# Patient Record
Sex: Female | Born: 1955 | Race: White | Hispanic: No | Marital: Single | Smoking: Former smoker
Health system: Southern US, Community
[De-identification: ages and names within clinical notes are randomized; demographics above are authoritative.]

---

## 2009-06-03 ENCOUNTER — Ambulatory Visit: Payer: Self-pay | Admitting: Family Medicine

## 2009-06-03 DIAGNOSIS — J069 Acute upper respiratory infection, unspecified: Secondary | ICD-10-CM | POA: Insufficient documentation

## 2009-06-03 LAB — CONVERTED CEMR LAB
Inflenza A Ag: NEGATIVE
Influenza B Ag: NEGATIVE

## 2009-06-28 ENCOUNTER — Encounter: Admission: RE | Admit: 2009-06-28 | Discharge: 2009-07-20 | Payer: Self-pay | Admitting: Orthopedic Surgery

## 2010-08-11 ENCOUNTER — Ambulatory Visit: Payer: Self-pay | Admitting: Family Medicine

## 2010-08-11 DIAGNOSIS — J939 Pneumothorax, unspecified: Secondary | ICD-10-CM | POA: Insufficient documentation

## 2010-08-11 DIAGNOSIS — R05 Cough: Secondary | ICD-10-CM

## 2010-08-11 DIAGNOSIS — J930 Spontaneous tension pneumothorax: Secondary | ICD-10-CM

## 2010-08-12 ENCOUNTER — Encounter: Payer: Self-pay | Admitting: Family Medicine

## 2010-08-12 ENCOUNTER — Ambulatory Visit: Payer: Self-pay | Admitting: Family Medicine

## 2010-08-14 ENCOUNTER — Telehealth (INDEPENDENT_AMBULATORY_CARE_PROVIDER_SITE_OTHER): Payer: Self-pay | Admitting: *Deleted

## 2010-08-29 ENCOUNTER — Encounter: Payer: Self-pay | Admitting: Family Medicine

## 2010-08-30 ENCOUNTER — Encounter: Payer: Self-pay | Admitting: Family Medicine

## 2010-10-11 ENCOUNTER — Telehealth (INDEPENDENT_AMBULATORY_CARE_PROVIDER_SITE_OTHER): Payer: Self-pay | Admitting: *Deleted

## 2010-11-27 NOTE — Letter (Signed)
Summary: RELEASE OF RECORDS TO PCP FORM  RELEASE OF RECORDS TO PCP FORM   Imported By: Dannette Barbara 08/29/2010 12:31:37  _____________________________________________________________________  External Attachment:    Type:   Image     Comment:   External Document

## 2010-11-27 NOTE — Assessment & Plan Note (Signed)
Summary: TAKE XRAY/TJ    Current Allergies: ! ASA ! * MENTHOL  Plan New Medications/Changes: TUSSICAPS 10-8 MG XR12H-CAP (HYDROCOD POLST-CHLORPHEN POLST) One by mouth hs as needed cough  #10 (ten) x 0, 08/12/2010, Donna Christen MD ADVAIR DISKUS 100-50 MCG/DOSE MISC (FLUTICASONE-SALMETEROL) 1 inhalation two times a day  #1 disp pk 60 x 0, 08/12/2010, Donna Christen MD   The patient and/or caregiver has been counseled thoroughly with regard to medications prescribed including dosage, schedule, interactions, rationale for use, and possible side effects and they verbalize understanding.  Diagnoses and expected course of recovery discussed and will return if not improved as expected or if the condition worsens. Patient and/or caregiver verbalized understanding.  Prescriptions: TUSSICAPS 10-8 MG XR12H-CAP (HYDROCOD POLST-CHLORPHEN POLST) One by mouth hs as needed cough  #10 (ten) x 0   Entered and Authorized by:   Donna Christen MD   Signed by:   Donna Christen MD on 08/12/2010   Method used:   Print then Give to Patient   RxID:   [id number] ADVAIR DISKUS 100-50 MCG/DOSE MISC (FLUTICASONE-SALMETEROL) 1 inhalation two times a day  #1 disp pk 60 x 0   Entered and Authorized by:   Donna Christen MD   Signed by:   Donna Christen MD on 08/12/2010   Method used:   Print then Give to Patient   RxID:   [id number]

## 2010-11-27 NOTE — Letter (Signed)
Summary: External Other  External Other   Imported By: Dannette Barbara 08/30/2010 09:46:46  _____________________________________________________________________  External Attachment:    Type:   Image     Comment:   External Document

## 2010-11-27 NOTE — Progress Notes (Signed)
  Phone Note Outgoing Call Call back at [facility] Phone [phone]   Call placed by: Emilio Math,  August 14, 2010 3:28 PM Call placed to: Patient Summary of Call: Feeling better, cough better, still congested

## 2010-11-27 NOTE — Assessment & Plan Note (Signed)
    Vital Signs:  Patient Profile:   55 Years Old Female Height:     65 inches O2 Sat:      97 % Pulse rate:   87 / minute  Vitals Entered By: Joanne Chars CMA (August 12, 2010 2:34 PM)  Subjective:  Patient reports that she feels somewhat better but still has persistent cough at night despite Tessalon Perles.   No shortness of breath or pleuritic pain.  Albuterol inhaler helps somewhat.  No fever.  Objective:  Appearance:  Patient appears healthy, stated age, and in no acute distress  Lungs:  Clear to auscultation.  Breath sounds are equal.  Heart:  Regular rate and rhythm without murmurs, rubs, or gallops.   CHEST - 2 VIEW 08/12/2010:   Comparison: Two-view chest x-ray yesterday.   Findings: No residual right pneumothorax.  Emphysematous changes in the upper lobes, right greater than left, and pulmonary hyperinflation, unchanged.  Lungs clear.  No pleural effusions. Cardiomediastinal silhouette unremarkable.  Mild degenerative changes involving the thoracic spine with mild compression deformity of the upper endplate of a mid thoracic vertebra as noted.   IMPRESSION: No residual right pneumothorax.  COPD/emphysema.  No acute cardiopulmonary disease currently.  Assessment:   Pneumothorax resolved Persistent cough COPD  Note compression deformity of mid thoracic vertebra  Plan:    Add Advair.  May continue albuterol MDI.  Finish antibiotic.  Switch to Tussicaps at bedtime. Follow-up with PCP if cough not improving. Recommend follow-up with PCP for DEXA scan, vitamin D level.  Discussed calcium supplement.

## 2010-11-27 NOTE — Letter (Signed)
Summary: CONTROLLED MED RX POLICY  CONTROLLED MED RX POLICY   Imported By: Dannette Barbara 08/12/2010 16:54:36  _____________________________________________________________________  External Attachment:    Type:   Image     Comment:   External Document

## 2010-11-27 NOTE — Assessment & Plan Note (Signed)
Summary: Cough- x 2 mths, HA, sorethroat, fatigue x 2 dys rm 4   Vital Signs:  Patient Profile:   55 Years Old Female CC:      Cold & URI symptoms Height:     65 inches Weight:      203 pounds O2 Sat:      99 % O2 treatment:    Room Air Temp:     98.8 degrees F oral Pulse rate:   79 / minute Pulse rhythm:   regular Resp:     16 per minute BP sitting:   111 / 75  (left arm) Cuff size:   regular  Vitals Entered By: Areta Haber CMA (August 11, 2010 9:34 AM)                  Current Allergies (reviewed today): ! ASA ! * MENTHOL     History of Present Illness Chief Complaint: Cold & URI symptoms History of Present Illness: 55 yo F here with 2 months of cough that started with a scratchy throat and postnasal drip.  Cough slightly better about a week ago then worsened again.  Last august had similar symptoms and remotely had severe allergies.  No fevers, chills, sweats.  No ear pain.  No current headache but did have one.  Tried guaifenesin periodically.  Current Problems: COUGH (ICD-786.2) URI (ICD-465.9)   Current Meds * GUAIFENESIN one every 4 hours * EXCEDRIN 500mg  every 6 hours RESTASIS 0.05 % EMUL (CYCLOSPORINE) as directed PROTONIX 40 MG SOLR (PANTOPRAZOLE SODIUM) 1 tab by mouth once daily AZITHROMYCIN 250 MG TABS (AZITHROMYCIN) 2 tabs by mouth day 1, 1 tab by mouth days 2-5 VENTOLIN HFA 108 (90 BASE) MCG/ACT AERS (ALBUTEROL SULFATE) 2 puffs inhaled every 4 hours as needed for cough TESSALON PERLES 100 MG CAPS (BENZONATATE) 1 cap by mouth three times a day as needed cough  REVIEW OF SYSTEMS Constitutional Symptoms       Complains of fatigue.     Denies fever, chills, night sweats, weight loss, and weight gain.  Eyes       Denies change in vision, eye pain, eye discharge, glasses, contact lenses, and eye surgery. Ear/Nose/Throat/Mouth       Complains of sinus problems, sore throat, and hoarseness.      Denies hearing loss/aids, change in hearing, ear pain,  ear discharge, dizziness, frequent runny nose, frequent nose bleeds, and tooth pain or bleeding.      Comments: x 2 dys Respiratory       Complains of dry cough and shortness of breath.      Denies productive cough, wheezing, asthma, bronchitis, and emphysema/COPD.  Cardiovascular       Denies murmurs, chest pain, and tires easily with exhertion.    Gastrointestinal       Denies stomach pain, nausea/vomiting, diarrhea, constipation, blood in bowel movements, and indigestion. Genitourniary       Denies painful urination, kidney stones, and loss of urinary control. Neurological       Complains of headaches.      Denies paralysis, seizures, and fainting/blackouts. Musculoskeletal       Complains of muscle pain and joint pain.      Denies joint stiffness, decreased range of motion, redness, swelling, muscle weakness, and gout.  Skin       Denies bruising, unusual mles/lumps or sores, and hair/skin or nail changes.  Psych       Denies mood changes, temper/anger issues, anxiety/stress, speech problems, depression, and sleep  problems. Other Comments: cough - x . Pt has not seen her PCP for this.   Past History:  Past Medical History: Last updated: 06/03/2009 plantar fasciaitis  reflux  Past Surgical History: Last updated: 06/03/2009 hysterectomy laparatomy laparoscopy excision of cyst foot surgery tubal ligation eye surgery knee surgery  Family History: Last updated: 06/03/2009 mother alive and healthy father alive - alzheimers brother alive and healthy sister alive and healthy  Social History: Last updated: 06/03/2009 denies drinking denies smoking denies recreational drug use Physical Exam General appearance: well developed, well nourished, no acute distress Head: normocephalic, atraumatic Eyes: conjunctivae and lids normal Ears: normal, no lesions or deformities Nasal: no nasal crusting Oral/Pharynx: tongue normal, posterior pharynx without erythema or  exudate Neck: mild tender right ant lymph node but not enlarged. Chest/Lungs: no rales, wheezes, or rhonchi bilateral, breath sounds equal without effort Heart: regular rate and  rhythm, no murmur Skin: no obvious rashes or lesions Assessment New Problems: COUGH (ICD-786.2)  Cough 2/2 back to back URIs vs allergy/asthma  Plan New Medications/Changes: TESSALON PERLES 100 MG CAPS (BENZONATATE) 1 cap by mouth three times a day as needed cough  #30 x 0, 08/11/2010, Norton Blizzard MD VENTOLIN HFA 108 (90 BASE) MCG/ACT AERS (ALBUTEROL SULFATE) 2 puffs inhaled every 4 hours as needed for cough  #1 x 0, 08/11/2010, Norton Blizzard MD AZITHROMYCIN 250 MG TABS (AZITHROMYCIN) 2 tabs by mouth day 1, 1 tab by mouth days 2-5  #6 x 0, 08/11/2010, Norton Blizzard MD  New Orders: T-Chest x-ray, 2 views [71020] Est. Patient Level III [13086] Planning Comments:   Very small PTX that I believe is due to coughing, bleb rupture as well.  Lung sounds heard throughout, pulse ox 99%, no resp distress.  Regardless, stressed that we need outpatient follow up chest x-ray in 24 hours to reassess and ensure this is not enlarging - if so will need monitoring in hospital setting and probably CVTS consult.  If worsening or severe shortness of breath,  told to call [phone].  Treat as bronchitis/URI first but if not improving favor cough variant asthma/allergy and would treat with albuterol inhaler and zyrtec.   The patient and/or caregiver has been counseled thoroughly with regard to medications prescribed including dosage, schedule, interactions, rationale for use, and possible side effects and they verbalize understanding.  Diagnoses and expected course of recovery discussed and will return if not improved as expected or if the condition worsens. Patient and/or caregiver verbalized understanding.  Prescriptions: TESSALON PERLES 100 MG CAPS (BENZONATATE) 1 cap by mouth three times a day as needed cough  #30 x 0   Entered and  Authorized by:   Norton Blizzard MD   Signed by:   Norton Blizzard MD on 08/11/2010   Method used:   Electronically to        [facility]  [facility] [phone]* (retail)       [address] [place], [place]  [address]       Ph: [phone]       Fax: [phone]   RxID:   [id number] VENTOLIN HFA 108 (90 BASE) MCG/ACT AERS (ALBUTEROL SULFATE) 2 puffs inhaled every 4 hours as needed for cough  #1 x 0   Entered and Authorized by:   Norton Blizzard MD   Signed by:   Norton Blizzard MD on 08/11/2010   Method used:   Electronically to        [facility]  [id number]* (retail)       [address] [place], [place]  [address]       Ph: [phone]       Fax: [phone]   RxID:   [id number] AZITHROMYCIN 250 MG TABS (AZITHROMYCIN) 2 tabs by mouth day 1, 1 tab by mouth days 2-5  #6 x 0   Entered and Authorized by:   Norton Blizzard MD   Signed by:   Norton Blizzard MD on 08/11/2010   Method used:   Electronically to        [facility]  [facility] [phone]* (retail)       [address] [place], [place]  [address]       Ph: [phone]       Fax: [phone]   RxID:   [id number]   Patient Instructions: 1)  Take antibiotic and cough medicine as directed. 2)  Return in 24 hours to have repeat x-ray to make sure the area in your lung is not worsening. 3)  If not improving after 7-10 days (cough), try inhaler when coughing and zyrtec 10mg  daily.  Orders Added: 1)  T-Chest x-ray, 2 views [71020] 2)  Est. Patient Level III [76160]

## 2010-11-29 NOTE — Progress Notes (Signed)
Summary: PCP called for 08/11/2010 Chest xray report-faxed to PCP.  Phone Note From Other Clinic   Action Taken: Phone Call Completed Summary of Call: PCP called for 08/11/2010 Chest xray report-faxed to [facility] [phone]. Initial call taken by: Pearlie Oyster

## 2012-05-07 IMAGING — CR DG CHEST 2V
2 series · 2 of 2 positions shown · non-contrast
Comparison: Two-view chest x-ray yesterday.

CLINICAL DATA: Follow up right apical pneumothorax.

CHEST - 2 VIEW 08/12/2010:

[view not recorded (1 of 2)]
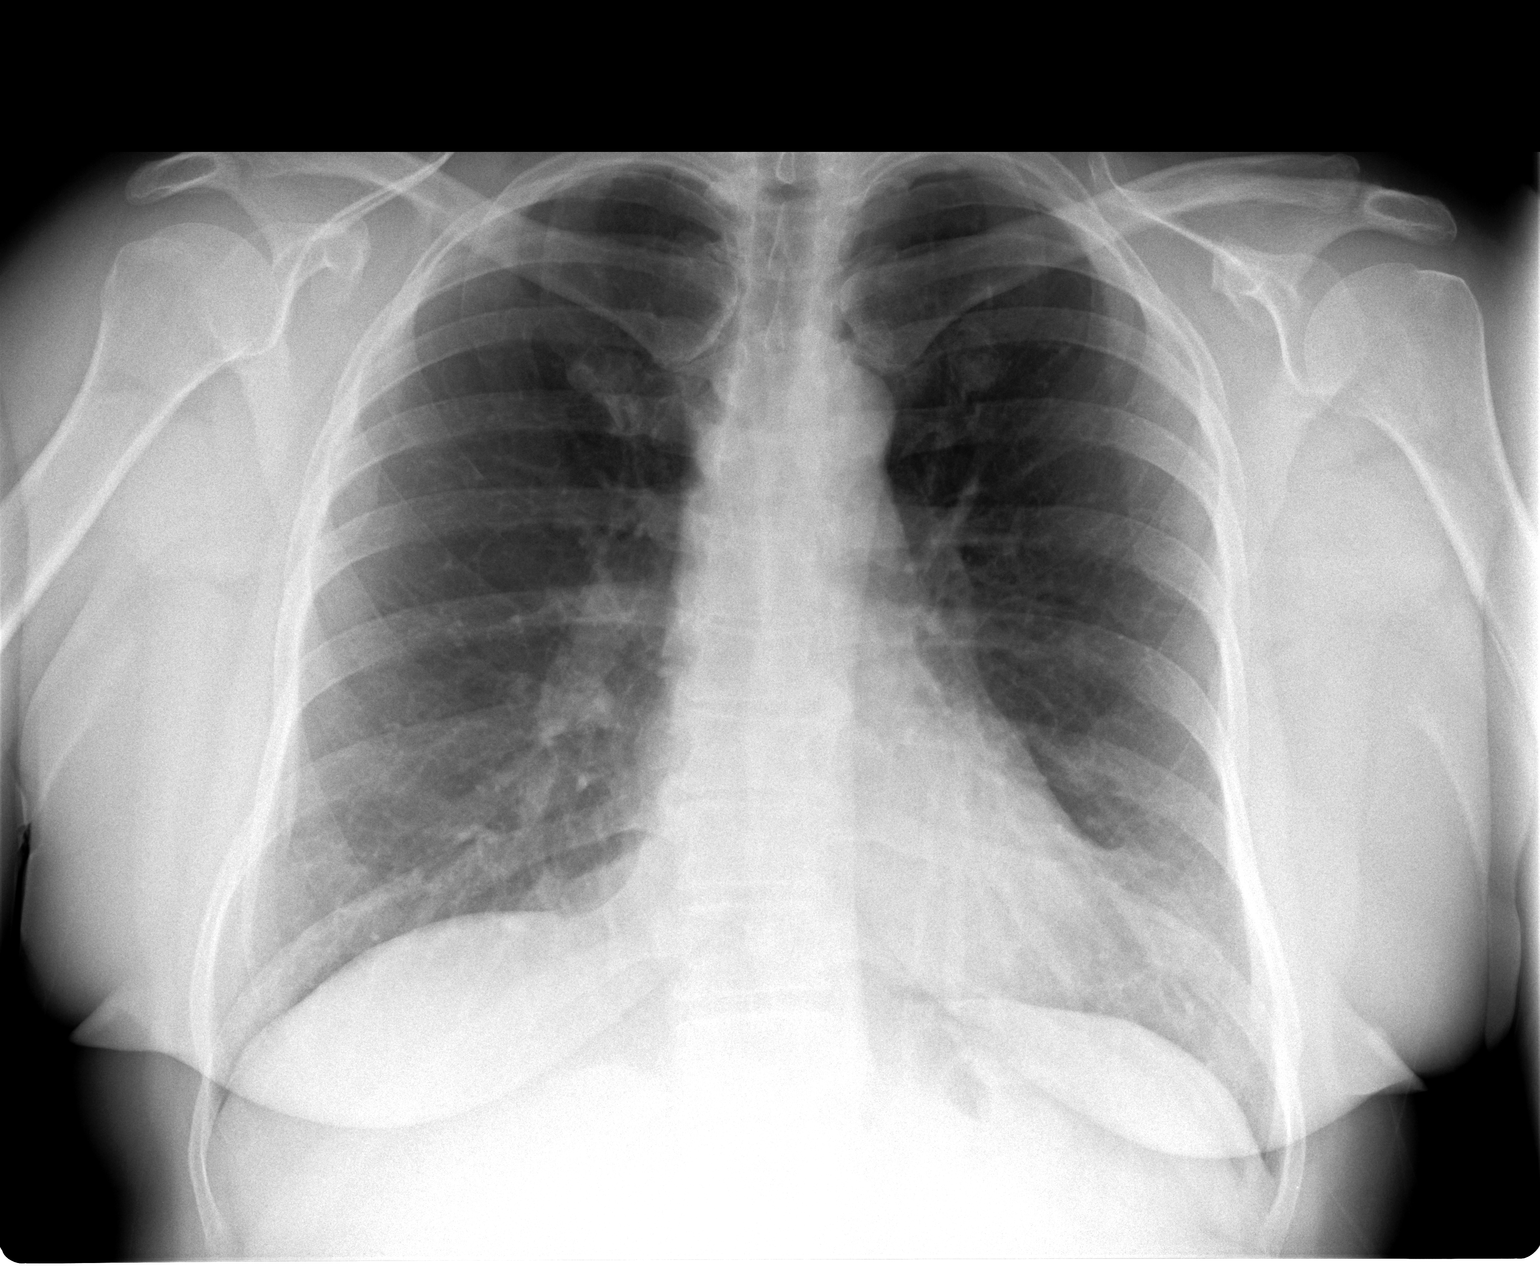

[view not recorded (2 of 2)]
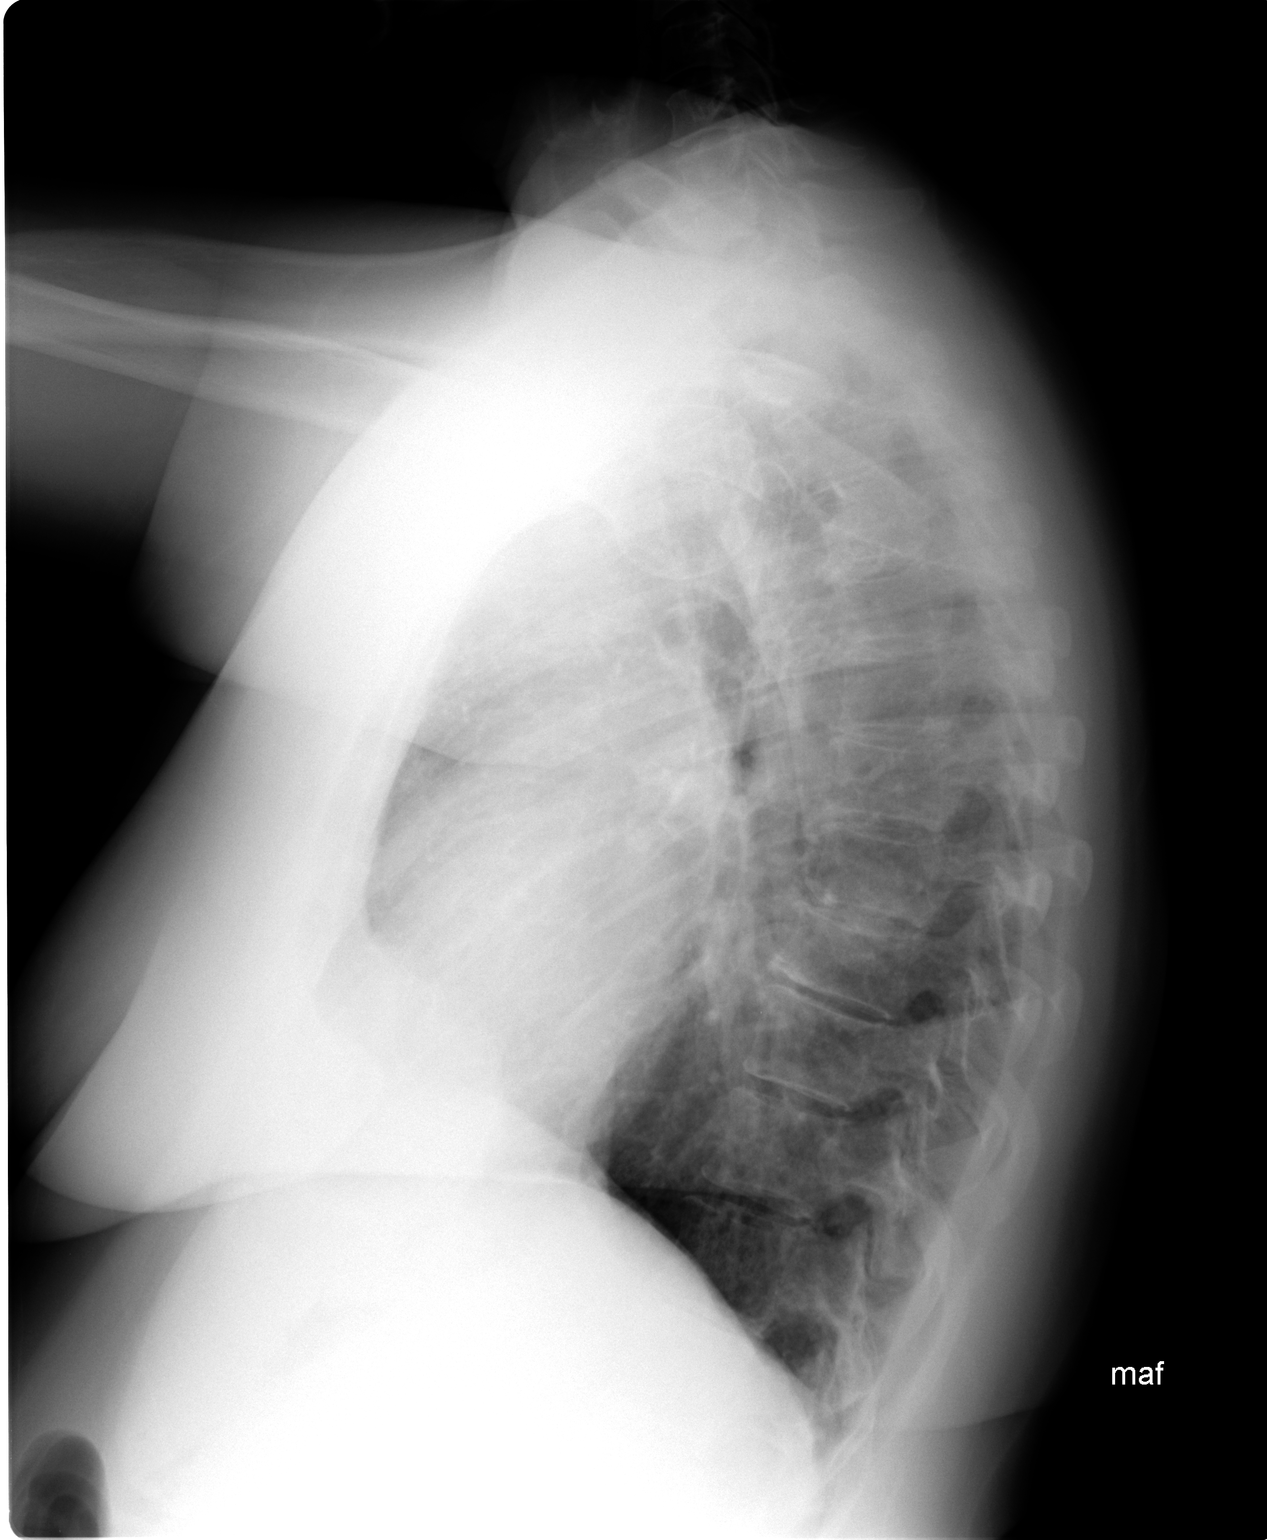

[2 of 2 positions shown; findings below may reference images not displayed]

FINDINGS: No residual right pneumothorax.  Emphysematous changes in
the upper lobes, right greater than left, and pulmonary
hyperinflation, unchanged.  Lungs clear.  No pleural effusions.
Cardiomediastinal silhouette unremarkable.  Mild degenerative
changes involving the thoracic spine with mild compression
deformity of the upper endplate of a mid thoracic vertebra as
noted.
IMPRESSION: No residual right pneumothorax.  COPD/emphysema.  No acute
cardiopulmonary disease currently.

## 2016-03-03 ENCOUNTER — Encounter: Payer: Self-pay | Admitting: Emergency Medicine

## 2016-03-03 ENCOUNTER — Emergency Department (INDEPENDENT_AMBULATORY_CARE_PROVIDER_SITE_OTHER)
Admission: EM | Admit: 2016-03-03 | Discharge: 2016-03-03 | Disposition: A | Discharge status: Home / Self Care | Payer: BLUE CROSS/BLUE SHIELD | Source: Home / Self Care | Attending: Family Medicine | Admitting: Family Medicine

## 2016-03-03 DIAGNOSIS — J209 Acute bronchitis, unspecified: Secondary | ICD-10-CM

## 2016-03-03 DIAGNOSIS — J019 Acute sinusitis, unspecified: Secondary | ICD-10-CM

## 2016-03-03 MED ORDER — BENZONATATE 100 MG PO CAPS: 100.0000 mg | ORAL_CAPSULE | Freq: Three times a day (TID) | ORAL | Status: AC

## 2016-03-03 MED ORDER — DOXYCYCLINE HYCLATE 100 MG PO CAPS: 100.0000 mg | ORAL_CAPSULE | Freq: Two times a day (BID) | ORAL | Status: AC

## 2016-03-03 NOTE — ED Provider Notes (Signed)
CSN: [id number]     Arrival date & time 03/03/16  1106 History   First MD Initiated Contact with Patient 03/03/16 1119     Chief Complaint  Patient presents with  . Nasal Congestion  . Cough  . URI   (Consider location/radiation/quality/duration/timing/severity/associated sxs/prior Treatment) HPI The pt is a 60yo female presenting to [facility] with c/o gradually worsening sinus congestion with moderately productive cough, green sputum causing nausea and 2 episodes of post-tussive vomiting yesterday.  She has had an intermittent subjective fever.  She notes her husband has also been sick but she has gotten worse faster.  Pt also c/o frontal headache with sinus pressure. She has been taking OTC mucinex, Dayquil and Nyquil, starting 2 days ago but states she has only had minimal relief of symptoms. Denies chest pain, SOB, or hx of asthma or COPD.  History reviewed. No pertinent past medical history. History reviewed. No pertinent past surgical history. History reviewed. No pertinent family history. Social History  Substance Use Topics  . Smoking status: Former Games developer  . Smokeless tobacco: Former Neurosurgeon  . Alcohol Use: Yes     Comment: 1 per month   OB History    No data available     Review of Systems  Constitutional: Positive for fever ( subjective), chills and fatigue.  HENT: Positive for congestion, ear pain, postnasal drip, rhinorrhea, sinus pressure and sore throat. Negative for trouble swallowing and voice change.   Respiratory: Positive for cough and chest tightness. Negative for shortness of breath.   Cardiovascular: Negative for chest pain and palpitations.  Gastrointestinal: Positive for nausea and abdominal pain ( generalized). Negative for vomiting and diarrhea.  Musculoskeletal: Positive for myalgias and arthralgias. Negative for back pain.  Skin: Negative for rash.  Neurological: Positive for headaches. Negative for dizziness and light-headedness.  All other systems reviewed and  are negative.   Allergies  Aspirin and Menthol  Home Medications   Prior to Admission medications   Medication Sig Start Date End Date Taking? Authorizing Provider  B Complex Vitamins (B-COMPLEX/B-12) TABS Take by mouth.   Yes Historical Provider, MD  Glucosamine-Chondroit-Vit C-Mn (GLUCOSAMINE 1500 COMPLEX PO) Take by mouth.   Yes Historical Provider, MD  Omega-3 Fatty Acids (FISH OIL) 1200 MG CAPS Take by mouth.   Yes Historical Provider, MD  Turmeric, Corwin Levins, POWD by Does not apply route.   Yes Historical Provider, MD  benzonatate (TESSALON) 100 MG capsule Take 1 capsule (100 mg total) by mouth every 8 (eight) hours. 03/03/16   Junius Finner, PA-C  doxycycline (VIBRAMYCIN) 100 MG capsule Take 1 capsule (100 mg total) by mouth 2 (two) times daily. One po bid x 10 days 03/03/16   Junius Finner, PA-C   Meds Ordered and Administered this Visit  Medications - No data to display  BP 121/85 mmHg  Pulse 108  Temp(Src) 98.9 F (37.2 C) (Oral)  Resp 16  Ht  (1.575 m)  Wt 210 lb 8 oz (95.482 kg)  BMI 38.49 kg/m2  SpO2 95% No data found.   Physical Exam  Constitutional: She appears well-developed and well-nourished. No distress.  HENT:  Head: Normocephalic and atraumatic.  Right Ear: Tympanic membrane normal.  Left Ear: Tympanic membrane normal.  Nose: Mucosal edema present. Right sinus exhibits maxillary sinus tenderness and frontal sinus tenderness. Left sinus exhibits maxillary sinus tenderness and frontal sinus tenderness.  Mouth/Throat: Uvula is midline and mucous membranes are normal. Posterior oropharyngeal erythema present. No oropharyngeal exudate, posterior oropharyngeal edema or  tonsillar abscesses.  Eyes: Conjunctivae are normal. No scleral icterus.  Neck: Normal range of motion. Neck supple.  Cardiovascular: Normal rate, regular rhythm and normal heart sounds.   Pulmonary/Chest: Effort normal and breath sounds normal. No stridor. No respiratory distress. She has  no wheezes. She has no rales.  Faint rhonchi in upper lung fields, clears with cough. No respiratory distress.  Abdominal: Soft. She exhibits no distension. There is no tenderness.  Musculoskeletal: Normal range of motion.  Lymphadenopathy:    She has no cervical adenopathy.  Neurological: She is alert.  Skin: Skin is warm and dry. She is not diaphoretic.  Nursing note and vitals reviewed.   ED Course  Procedures (including critical care time)  Labs Review Labs Reviewed - No data to display  Imaging Review No results found.    MDM   1. Acute rhinosinusitis   2. Acute bronchitis, unspecified organism    Pt c/o worsening productive cough with post-tussive vomiting and sinus pain and pressure with headache and nausea.   Will cover for bacterial cause given worsening of symptoms and sinus tenderness. Pt states she did have Augment in the past but had to change antibiotics because it did not work. She does not recall name of other antibiotic she was on at the time. No known allergies to antibiotics.  Rx: Doxycycline and Tessalon  May continue OTC Mucinex.  Advised pt to use acetaminophen and ibuprofen as needed for fever and pain. Encouraged rest and fluids. F/u with PCP in 7-10 days if not improving, sooner if worsening. Pt verbalized understanding and agreement with tx plan. Work note provided for tomorrow.      Junius Finnerrin O'Malley, PA-C 03/03/16 1208

## 2016-03-03 NOTE — ED Notes (Signed)
Patient stated sick since Monday with cough cold and nasal congestion. Cough has progressed to productive with greenish sputum causing nausea and vomiting times two. Intermittent fever.

## 2016-03-03 NOTE — Discharge Instructions (Signed)
You may take 400-600mg  Ibuprofen (Motrin) every 6-8 hours for fever and pain  Alternate with Tylenol  You may take  Tylenol every 4-6 hours as needed for fever and pain   You may continue to take Dayquil, Nyquil and Mucinex.  Make sure you read the labels carefully as most combination cough/cold medications have Tylenol already in them.  Do not take more than the recommended daily dose of Tylenol (no more than a total of 4g or  per day)  Follow-up with your primary care provider next week for recheck of symptoms if not improving.  Be sure to drink plenty of fluids and rest, at least 8hrs of sleep a night, preferably more while you are sick. Return urgent care or go to closest ER if you cannot keep down fluids/signs of dehydration, fever not reducing with Tylenol, difficulty breathing/wheezing, stiff neck, worsening condition, or other concerns (see below)  Please take antibiotics as prescribed and be sure to complete entire course even if you start to feel better to ensure infection does not come back.   Sinus Rinse WHAT IS A SINUS RINSE? A sinus rinse is a simple home treatment that is used to rinse your sinuses with a sterile mixture of salt and water (saline solution). Sinuses are air-filled spaces in your skull behind the bones of your face and forehead that open into your nasal cavity. You will use the following:  Saline solution.  Neti pot or spray bottle. This releases the saline solution into your nose and through your sinuses. Neti pots and spray bottles can be purchased at [employer], a health food store, or online. WHEN WOULD I DO A SINUS RINSE? A sinus rinse can help to clear mucus, dirt, dust, or pollen from the nasal cavity. You may do a sinus rinse when you have a cold, a virus, nasal allergy symptoms, a sinus infection, or stuffiness in the nose or sinuses. If you are considering a sinus rinse:  Ask your child's health care provider before performing a sinus  rinse on your child.  Do not do a sinus rinse if you have had ear or nasal surgery, ear infection, or blocked ears. HOW DO I DO A SINUS RINSE?  Wash your hands.  Disinfect your device according to the directions provided and then dry it.  Use the solution that comes with your device or one that is sold separately in stores. Follow the mixing directions on the package.  Fill your device with the amount of saline solution as directed by the device instructions.  Stand over a sink and tilt your head sideways over the sink.  Place the spout of the device in your upper nostril (the one closer to the ceiling).  Gently pour or squeeze the saline solution into the nasal cavity. The liquid should drain to the lower nostril if you are not overly congested.  Gently blow your nose. Blowing too hard may cause ear pain.  Repeat in the other nostril.  Clean and rinse your device with clean water and then air-dry it. ARE THERE RISKS OF A SINUS RINSE?  Sinus rinse is generally very safe and effective. However, there are a few risks, which include:   A burning sensation in the sinuses. This may happen if you do not make the saline solution as directed. Make sure to follow all directions when making the saline solution.  Infection from contaminated water. This is rare, but possible.  Nasal irritation.   This information is not intended  to replace advice given to you by your health care provider. Make sure you discuss any questions you have with your health care provider.   Document Released: 05/11/2014 Document Reviewed: 05/11/2014 Elsevier Interactive Patient Education 2016 [employer].  Sinusitis, Adult Sinusitis is redness, soreness, and puffiness (inflammation) of the air pockets in the bones of your face (sinuses). The redness, soreness, and puffiness can cause air and mucus to get trapped in your sinuses. This can allow germs to grow and cause an infection.  HOME CARE   Drink enough  fluids to keep your pee (urine) clear or pale yellow.  Use a humidifier in your home.  Run a hot shower to create steam in the bathroom. Sit in the bathroom with the door closed. Breathe in the steam 3-4 times a day.  Put a warm, moist washcloth on your face 3-4 times a day, or as told by your doctor.  Use salt water sprays (saline sprays) to wet the thick fluid in your nose. This can help the sinuses drain.  Only take medicine as told by your doctor. GET HELP RIGHT AWAY IF:   Your pain gets worse.  You have very bad headaches.  You are sick to your stomach (nauseous).  You throw up (vomit).  You are very sleepy (drowsy) all the time.  Your face is puffy (swollen).  Your vision changes.  You have a stiff neck.  You have trouble breathing. MAKE SURE YOU:   Understand these instructions.  Will watch your condition.  Will get help right away if you are not doing well or get worse.   This information is not intended to replace advice given to you by your health care provider. Make sure you discuss any questions you have with your health care provider.   Document Released: 04/01/2008 Document Revised: 11/04/2014 Document Reviewed: 05/19/2012 Elsevier Interactive Patient Education 2016 [employer]  Acute Bronchitis Bronchitis is when the airways that extend from the windpipe into the lungs get red, puffy, and painful (inflamed). Bronchitis often causes thick spit (mucus) to develop. This leads to a cough. A cough is the most common symptom of bronchitis. In acute bronchitis, the condition usually begins suddenly and goes away over time (usually in 2 weeks). Smoking, allergies, and asthma can make bronchitis worse. Repeated episodes of bronchitis may cause more lung problems. HOME CARE  Rest.  Drink enough fluids to keep your pee (urine) clear or pale yellow (unless you need to limit fluids as told by your doctor).  Only take over-the-counter or prescription medicines  as told by your doctor.  Avoid smoking and secondhand smoke. These can make bronchitis worse. If you are a smoker, think about using nicotine gum or skin patches. Quitting smoking will help your lungs heal faster.  Reduce the chance of getting bronchitis again by:  Washing your hands often.  Avoiding people with cold symptoms.  Trying not to touch your hands to your mouth, nose, or eyes.  Follow up with your doctor as told. GET HELP IF: Your symptoms do not improve after 1 week of treatment. Symptoms include:  Cough.  Fever.  Coughing up thick spit.  Body aches.  Chest congestion.  Chills.  Shortness of breath.  Sore throat. GET HELP RIGHT AWAY IF:   You have an increased fever.  You have chills.  You have severe shortness of breath.  You have bloody thick spit (sputum).  You throw up (vomit) often.  You lose too much body fluid (dehydration).  You have a severe headache.  You faint. MAKE SURE YOU:   Understand these instructions.  Will watch your condition.  Will get help right away if you are not doing well or get worse.   This information is not intended to replace advice given to you by your health care provider. Make sure you discuss any questions you have with your health care provider.   Document Released: 04/01/2008 Document Revised: 06/16/2013 Document Reviewed: 04/06/2013 Elsevier Interactive Patient Education [employer]2016 [employer]
